# Patient Record
Sex: Female | Born: 1980 | Marital: Married | Smoking: Never smoker
Health system: Southern US, Community
[De-identification: ages and names within clinical notes are randomized; demographics above are authoritative.]

## PROBLEM LIST (undated history)

## (undated) DIAGNOSIS — E039 Hypothyroidism, unspecified: Secondary | ICD-10-CM

## (undated) HISTORY — PX: NO PAST SURGERIES: SHX2092

---

## 2016-04-06 ENCOUNTER — Other Ambulatory Visit (HOSPITAL_COMMUNITY)
Admission: RE | Admit: 2016-04-06 | Discharge: 2016-04-06 | Disposition: A | Discharge status: Home / Self Care | Payer: Managed Care, Other (non HMO) | Source: Ambulatory Visit | Attending: Family Medicine | Admitting: Family Medicine

## 2016-04-06 DIAGNOSIS — Z01419 Encounter for gynecological examination (general) (routine) without abnormal findings: Secondary | ICD-10-CM | POA: Insufficient documentation

## 2016-04-06 DIAGNOSIS — Z1151 Encounter for screening for human papillomavirus (HPV): Secondary | ICD-10-CM | POA: Insufficient documentation

## 2019-01-13 ENCOUNTER — Other Ambulatory Visit: Payer: Self-pay | Admitting: Family Medicine

## 2019-01-13 DIAGNOSIS — N6320 Unspecified lump in the left breast, unspecified quadrant: Secondary | ICD-10-CM

## 2019-01-16 ENCOUNTER — Ambulatory Visit
Admission: RE | Admit: 2019-01-16 | Discharge: 2019-01-16 | Disposition: A | Discharge status: Home / Self Care | Payer: 59 | Source: Ambulatory Visit | Attending: Family Medicine | Admitting: Family Medicine

## 2019-01-16 ENCOUNTER — Ambulatory Visit
Admission: RE | Admit: 2019-01-16 | Discharge: 2019-01-16 | Disposition: A | Discharge status: Home / Self Care | Payer: Managed Care, Other (non HMO) | Source: Ambulatory Visit | Attending: Family Medicine | Admitting: Family Medicine

## 2019-01-16 DIAGNOSIS — N6320 Unspecified lump in the left breast, unspecified quadrant: Secondary | ICD-10-CM

## 2020-02-20 IMAGING — MG DIGITAL DIAGNOSTIC BILATERAL MAMMOGRAM WITH TOMO AND CAD
6 of 10 series · 6 of 30 positions shown · non-contrast
Comparison: Previous exam(s).

CLINICAL DATA: 37-year-old patient recently has palpated a lump in
the upper inner left breast. This is her baseline mammogram. She has
no family history of breast cancer.

EXAM:
DIGITAL DIAGNOSTIC BILATERAL MAMMOGRAM WITH CAD AND TOMO
ULTRASOUND LEFT BREAST

[L MLO synth-2D]
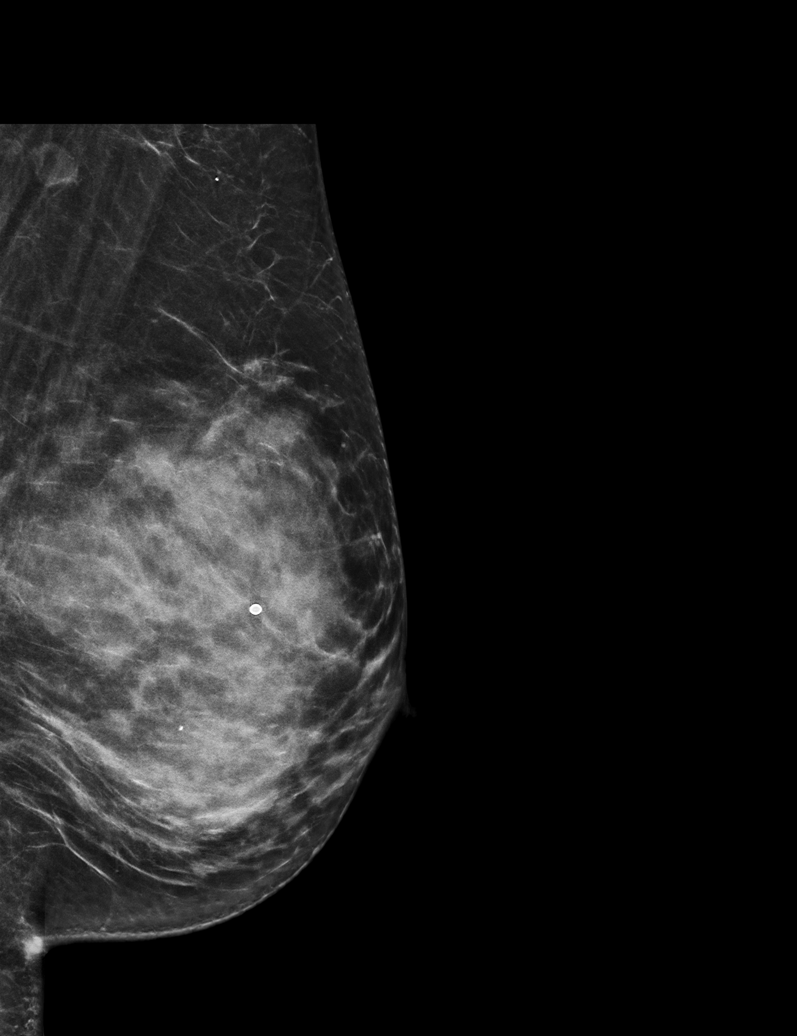

[L ML synth-2D]
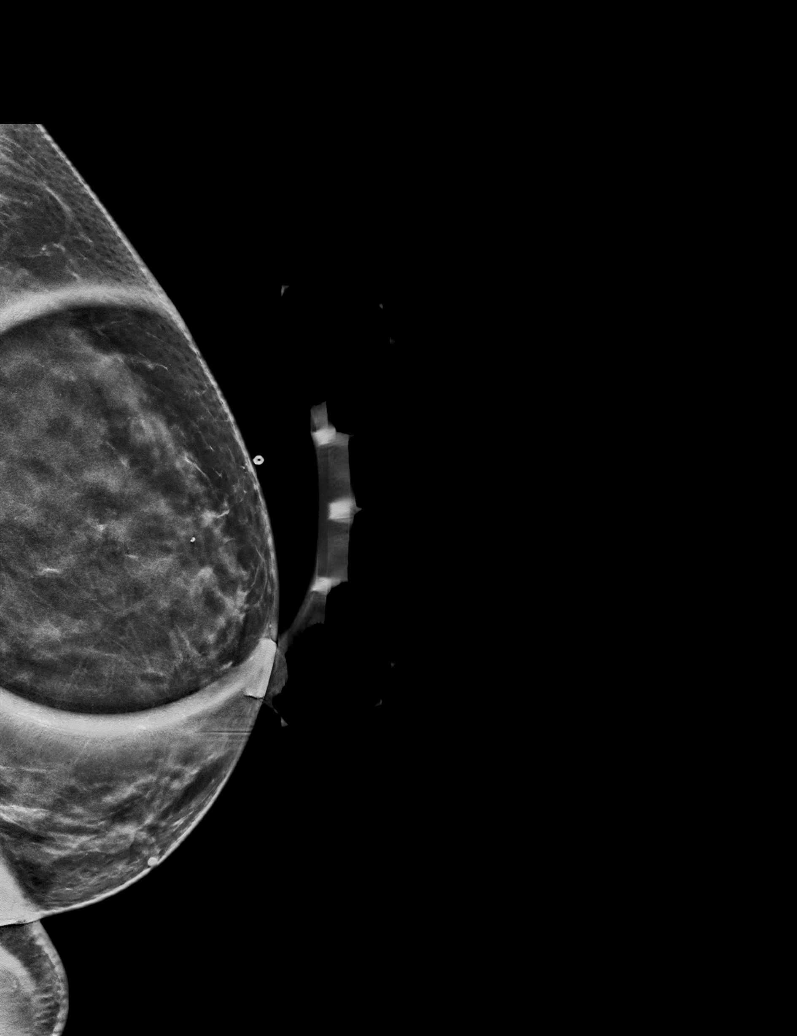

[R MLO synth-2D]
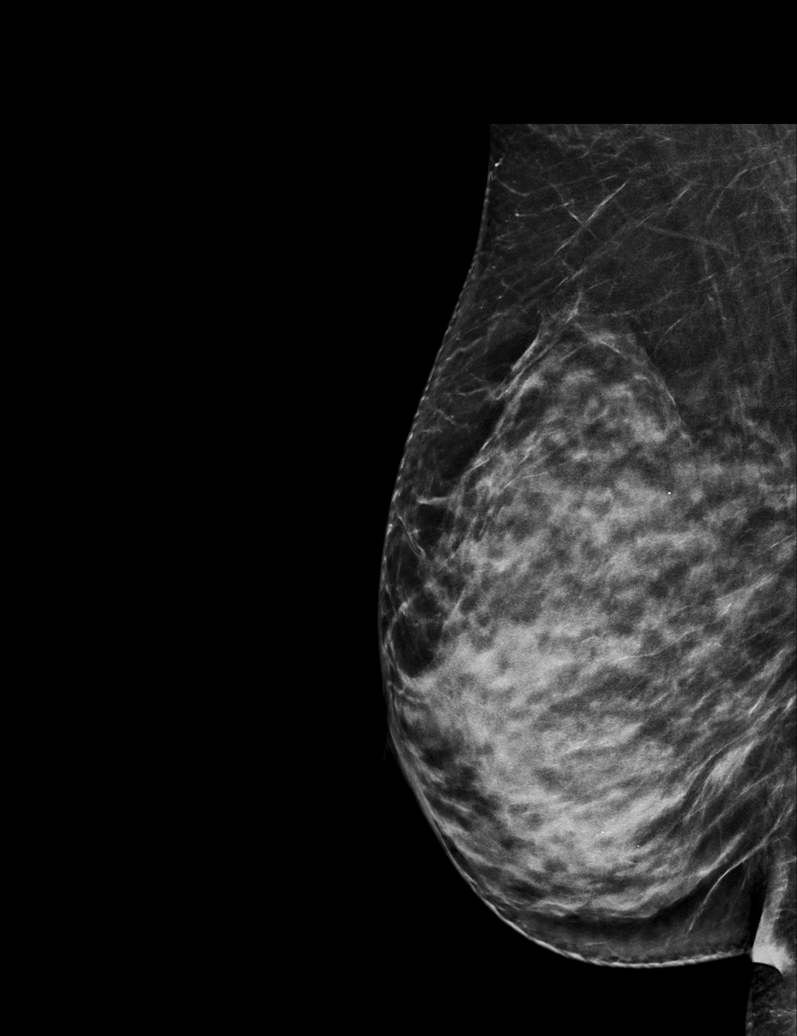

[R CC synth-2D]
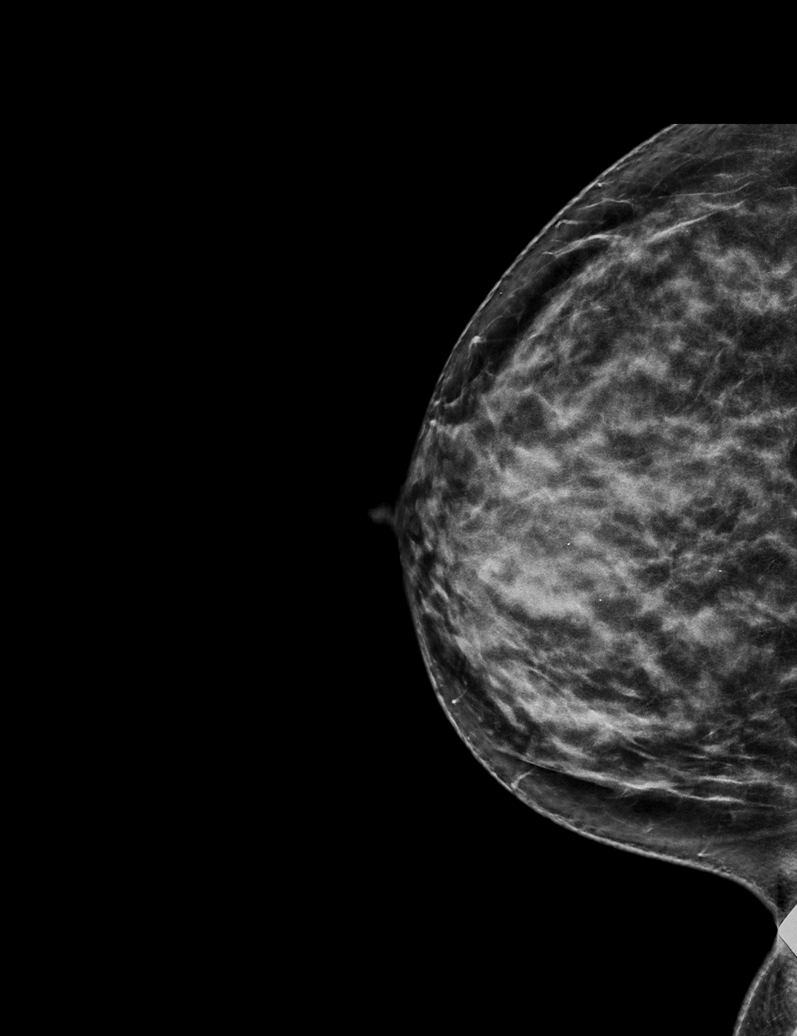

[L CC synth-2D]
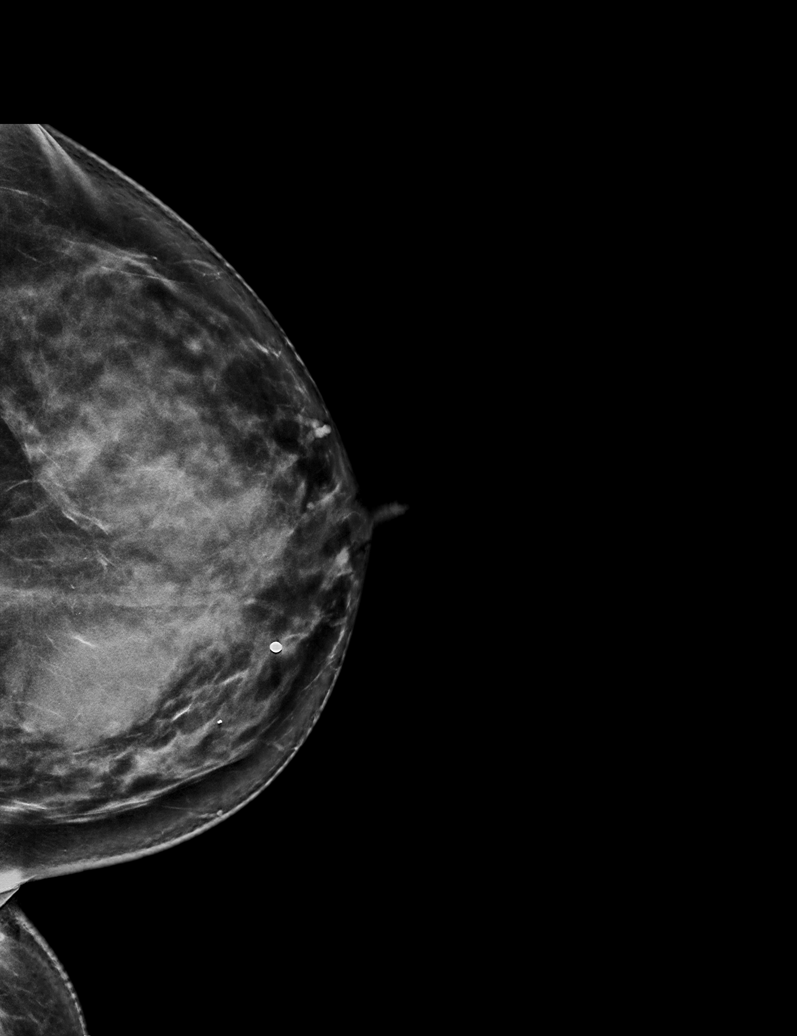

[R MLO tomo · tomo slice 35/70.0]
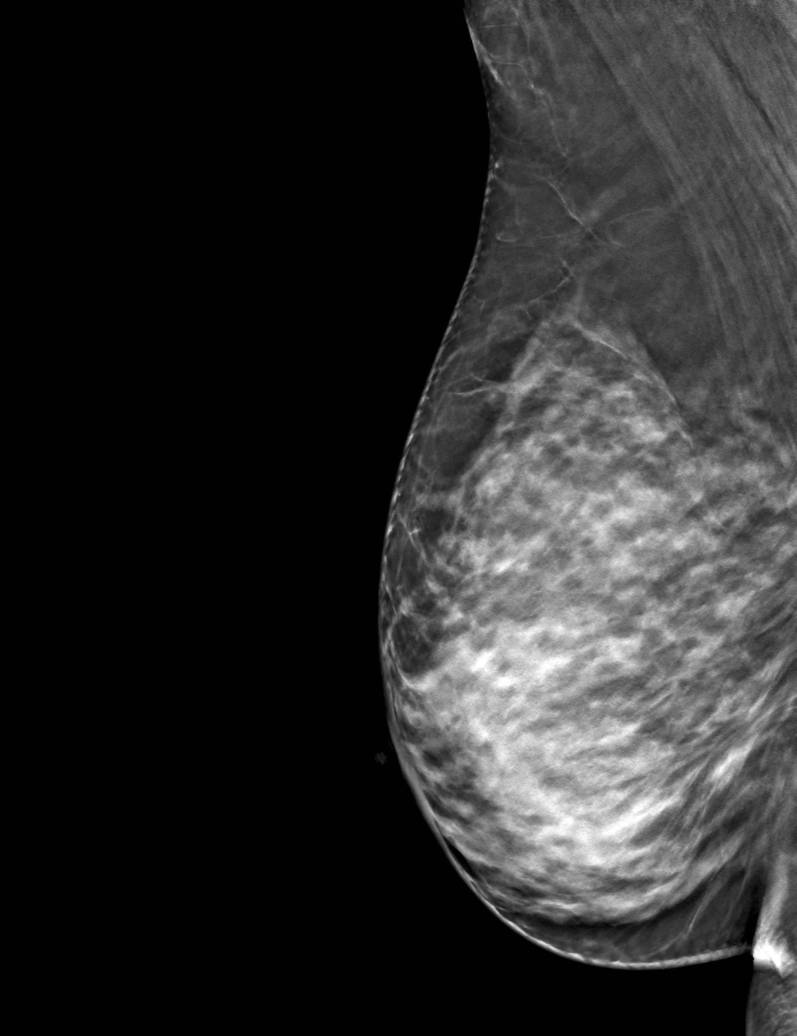

[6 of 30 positions shown; findings below may reference images not displayed]

ACR Breast Density Category d: The breast tissue is extremely dense,
which lowers the sensitivity of mammography.
FINDINGS: Metallic skin marker was placed in the region of palpable concern in
the upper inner left breast. Deep to the metallic skin marker is a
partially obscured oval mass measuring approximately 3.5 cm.
Immediately adjacent to this dominant mass is a second smaller oval
mass. No suspicious microcalcification or architectural distortion
in the left breast.

There are no findings to suggest malignancy in the right breast.

Mammographic images were processed with CAD.

Targeted ultrasound is performed of the palpable lump in the left
breast at 10 o'clock position 3 cm from the nipple. Corresponding to
the palpable mass is a 3.4 x 2.3 x 2.3 cm cyst with well-defined
posterior wall and posterior acoustic enhancement. There is no
associated vascular flow. Immediately adjacent to the palpable cyst
is a smaller oval cyst. Incidentally noted in the 11 o'clock left
breast 3 cm from nipple are two oval simple cysts, the largest
measuring approximately 1.2 cm.

No solid or suspicious masses identified in this region of the left
breast.
IMPRESSION: No evidence of malignancy in either breast. 3.4 cm benign cyst at 10
o'clock position left breast accounts for the palpable lump. A few
additional smaller cysts are seen in the upper inner quadrant of the
left breast on ultrasound.

No evidence of malignancy in either breast.

RECOMMENDATION:
Screening mammogram at age 41 unless there are persistent or
intervening clinical concerns. (Code:[id number]-[id number])

I have discussed the findings and recommendations with the patient.
Results were also provided in writing at the conclusion of the
visit. If applicable, a reminder letter will be sent to the patient
regarding the next appointment.

BI-RADS CATEGORY  2: Benign.

## 2020-04-01 ENCOUNTER — Other Ambulatory Visit: Payer: Self-pay | Admitting: Family Medicine

## 2020-04-05 ENCOUNTER — Ambulatory Visit
Admission: RE | Admit: 2020-04-05 | Discharge: 2020-04-05 | Disposition: A | Discharge status: Home / Self Care | Payer: 59 | Source: Ambulatory Visit | Attending: Family Medicine | Admitting: Family Medicine

## 2020-04-05 ENCOUNTER — Other Ambulatory Visit: Payer: Self-pay | Admitting: Family Medicine

## 2020-04-05 ENCOUNTER — Other Ambulatory Visit: Payer: Self-pay

## 2020-04-05 DIAGNOSIS — N6320 Unspecified lump in the left breast, unspecified quadrant: Secondary | ICD-10-CM

## 2020-04-05 DIAGNOSIS — N6310 Unspecified lump in the right breast, unspecified quadrant: Secondary | ICD-10-CM

## 2020-04-14 ENCOUNTER — Other Ambulatory Visit: Payer: Self-pay | Admitting: Family Medicine

## 2020-04-14 DIAGNOSIS — Z803 Family history of malignant neoplasm of breast: Secondary | ICD-10-CM

## 2020-04-14 DIAGNOSIS — Z1239 Encounter for other screening for malignant neoplasm of breast: Secondary | ICD-10-CM

## 2020-04-19 ENCOUNTER — Other Ambulatory Visit (HOSPITAL_COMMUNITY)
Admission: RE | Admit: 2020-04-19 | Discharge: 2020-04-19 | Disposition: A | Discharge status: Home / Self Care | Payer: 59 | Source: Ambulatory Visit | Attending: Family Medicine | Admitting: Family Medicine

## 2020-04-19 ENCOUNTER — Other Ambulatory Visit: Payer: Self-pay | Admitting: Family Medicine

## 2020-04-19 DIAGNOSIS — Z124 Encounter for screening for malignant neoplasm of cervix: Secondary | ICD-10-CM | POA: Insufficient documentation

## 2020-04-22 LAB — CYTOLOGY - PAP
Comment: NEGATIVE
Diagnosis: NEGATIVE
High risk HPV: NEGATIVE

## 2020-06-15 ENCOUNTER — Other Ambulatory Visit: Payer: Self-pay

## 2020-06-15 ENCOUNTER — Ambulatory Visit
Admission: RE | Admit: 2020-06-15 | Discharge: 2020-06-15 | Disposition: A | Discharge status: Home / Self Care | Payer: 59 | Source: Ambulatory Visit | Attending: Family Medicine | Admitting: Family Medicine

## 2020-06-15 DIAGNOSIS — Z1239 Encounter for other screening for malignant neoplasm of breast: Secondary | ICD-10-CM

## 2020-06-15 DIAGNOSIS — Z803 Family history of malignant neoplasm of breast: Secondary | ICD-10-CM

## 2020-06-15 MED ORDER — GADOBUTROL 1 MMOL/ML IV SOLN
7.0000 mL | Freq: Once | INTRAVENOUS | Status: AC | PRN
  Administered 2020-06-15: 7 mL via INTRAVENOUS

## 2021-05-11 ENCOUNTER — Other Ambulatory Visit: Payer: Self-pay | Admitting: Family Medicine

## 2021-05-11 DIAGNOSIS — Z1231 Encounter for screening mammogram for malignant neoplasm of breast: Secondary | ICD-10-CM

## 2021-07-08 ENCOUNTER — Ambulatory Visit: Payer: 59

## 2021-10-19 ENCOUNTER — Ambulatory Visit
Admission: RE | Admit: 2021-10-19 | Discharge: 2021-10-19 | Disposition: A | Discharge status: Home / Self Care | Payer: 59 | Source: Ambulatory Visit | Attending: Family Medicine | Admitting: Family Medicine

## 2021-10-19 ENCOUNTER — Other Ambulatory Visit: Payer: Self-pay

## 2021-10-19 DIAGNOSIS — Z1231 Encounter for screening mammogram for malignant neoplasm of breast: Secondary | ICD-10-CM

## 2022-07-18 ENCOUNTER — Other Ambulatory Visit: Payer: Self-pay

## 2022-07-18 ENCOUNTER — Encounter (HOSPITAL_BASED_OUTPATIENT_CLINIC_OR_DEPARTMENT_OTHER): Payer: Self-pay | Admitting: Emergency Medicine

## 2022-07-18 ENCOUNTER — Emergency Department (HOSPITAL_BASED_OUTPATIENT_CLINIC_OR_DEPARTMENT_OTHER): Payer: 59

## 2022-07-18 ENCOUNTER — Emergency Department (HOSPITAL_BASED_OUTPATIENT_CLINIC_OR_DEPARTMENT_OTHER)
Admission: EM | Admit: 2022-07-18 | Discharge: 2022-07-18 | Disposition: A | Discharge status: Home / Self Care | Payer: 59 | Attending: Emergency Medicine | Admitting: Emergency Medicine

## 2022-07-18 DIAGNOSIS — M79641 Pain in right hand: Secondary | ICD-10-CM | POA: Diagnosis present

## 2022-07-18 DIAGNOSIS — M79643 Pain in unspecified hand: Secondary | ICD-10-CM

## 2022-07-18 NOTE — Discharge Instructions (Signed)
Your x-ray today was normal.  You were diagnosed with a possible scaphoid fracture.  We placed you in a thumb spica splint today.  Please keep this on until evaluated by Orthopedics. Please call Dr. Melvyn Novas tomorrow morning to schedule follow-up appointment.

## 2022-07-18 NOTE — ED Provider Notes (Signed)
[facility] EMERGENCY DEPARTMENT Provider Note   CSN: [id number] Arrival date & time: 07/18/22  2050     History PMH: n/a Chief Complaint  Patient presents with   Hand Pain    Suzanne Quinn is a 41 y.o. female. She presents with a right hand injury after being pulled by her dog on a leash.  She has had right hand pain in her lower thumb as well as limited range of motion of her thumb.  She took Tylenol and ibuprofen at home and just now getting some minimal relief.  She denies any numbness or tingling in the extremity.   Hand Pain       Home Medications Prior to Admission medications   Not on File      Allergies    Patient has no known allergies.    Review of Systems   Review of Systems  Musculoskeletal:  Positive for arthralgias.  Neurological:  Negative for numbness.  All other systems reviewed and are negative.   Physical Exam Updated Vital Signs BP 115/75 (BP Location: Left Arm)   Pulse 74   Temp 98.3 F (36.8 C) (Oral)   Resp 20   Ht 5\' 7"  (1.702 m)   Wt 70.3 kg   SpO2 100%   BMI 24.28 kg/m  Physical Exam Vitals and nursing note reviewed.  Constitutional:      General: She is not in acute distress.    Appearance: Normal appearance. She is well-developed. She is not ill-appearing, toxic-appearing or diaphoretic.  HENT:     Head: Normocephalic and atraumatic.     Nose: No nasal deformity.     Mouth/Throat:     Lips: Pink. No lesions.  Eyes:     General: Gaze aligned appropriately. No scleral icterus.       Right eye: No discharge.        Left eye: No discharge.     Conjunctiva/sclera: Conjunctivae normal.     Right eye: Right conjunctiva is not injected. No exudate or hemorrhage.    Left eye: Left conjunctiva is not injected. No exudate or hemorrhage. Pulmonary:     Effort: Pulmonary effort is normal. No respiratory distress.  Musculoskeletal:     Comments: Right wrist is with no deformities or swelling.  She has a 2+ radial pulse.   Normal range of motion of her right wrist.  She does have positive snuffbox tenderness as well as limited range of motion of her left thumb.  There is minimal swelling overlying the snuffbox.  She is unable to fully flex and extend this.  Her sensation is intact distally.  She has no other areas of concern on her hand.  Skin:    General: Skin is warm and dry.  Neurological:     Mental Status: She is alert and oriented to person, place, and time.  Psychiatric:        Mood and Affect: Mood normal.        Speech: Speech normal.        Behavior: Behavior normal. Behavior is cooperative.     ED Results / Procedures / Treatments   Labs (all labs ordered are listed, but only abnormal results are displayed) Labs Reviewed - No data to display  EKG None  Radiology DG Hand Complete Right  Result Date: 07/18/2022 CLINICAL DATA:  Hand pain thumb pain EXAM: RIGHT HAND - COMPLETE 3+ VIEW COMPARISON:  None Available. FINDINGS: There is no evidence of fracture or dislocation. There is no  evidence of arthropathy or other focal bone abnormality. Soft tissues are unremarkable. IMPRESSION: Negative. Electronically Signed   By: Jasmine Pang M.D.   On: 07/18/2022 21:17    Procedures Procedures    Medications Ordered in ED Medications - No data to display  ED Course/ Medical Decision Making/ A&P                           Medical Decision Making Amount and/or Complexity of Data Reviewed Radiology: ordered.   Patient presents after a right hand injury that occurred today.  She is neurovascularly intact on exam.  She does have positive snuffbox tenderness.  An x-ray was obtained which did not show any acute fractures.  However, since patient has limited range of motion of the right thumb as well as snuffbox tenderness, we will place her in a thumb spica splint in case this is an occult scaphoid fracture.  She will follow-up with orthopedics.  I have recommended supportive treatment at home.  Final  Clinical Impression(s) / ED Diagnoses Final diagnoses:  Tenderness of anatomical snuffbox    Rx / DC Orders ED Discharge Orders     None         Claudie Leach, PA-C 07/18/22 2226    Rolan Bucco, MD 07/18/22 2250

## 2022-07-18 NOTE — ED Triage Notes (Signed)
Patient states she was pulled by her dog on a leash, c/o right hand pain, more in her thumb. Patient took tylenol and ibuprofen pta.

## 2022-07-31 ENCOUNTER — Other Ambulatory Visit: Payer: Self-pay | Admitting: Family Medicine

## 2022-07-31 ENCOUNTER — Other Ambulatory Visit: Payer: Self-pay | Admitting: Physician Assistant

## 2022-07-31 DIAGNOSIS — Z1231 Encounter for screening mammogram for malignant neoplasm of breast: Secondary | ICD-10-CM

## 2022-11-20 ENCOUNTER — Ambulatory Visit
Admission: RE | Admit: 2022-11-20 | Discharge: 2022-11-20 | Disposition: A | Discharge status: Home / Self Care | Payer: 59 | Source: Ambulatory Visit | Attending: Physician Assistant | Admitting: Physician Assistant

## 2022-11-20 DIAGNOSIS — Z1231 Encounter for screening mammogram for malignant neoplasm of breast: Secondary | ICD-10-CM

## 2023-05-11 ENCOUNTER — Ambulatory Visit (INDEPENDENT_AMBULATORY_CARE_PROVIDER_SITE_OTHER): Payer: 59 | Admitting: Student

## 2023-05-11 ENCOUNTER — Ambulatory Visit (INDEPENDENT_AMBULATORY_CARE_PROVIDER_SITE_OTHER): Payer: 59

## 2023-05-11 DIAGNOSIS — M79672 Pain in left foot: Secondary | ICD-10-CM

## 2023-05-11 DIAGNOSIS — M722 Plantar fascial fibromatosis: Secondary | ICD-10-CM

## 2023-05-11 HISTORY — DX: Plantar fascial fibromatosis: M72.2

## 2023-05-11 NOTE — Progress Notes (Signed)
Chief Complaint: Left foot pain     History of Present Illness:    Suzanne Quinn is a 42 y.o. female presenting today for evaluation of pain in her left foot and heel.  She states that just over a week ago she went for a 3 mile walk and shoes that were not very supportive.  She typically wears orthotic shoe inserts.  She has since been having a burning type pain in her heel and arch that is mild but very bothersome.  This is painful even at rest however it worsens with a lot of walking.  It also does bother her at night when trying to sleep.  She has been performing exercises on her own at home for plantar fasciitis.  Overall she states that this has been improving some over the last few days but is here for further evaluation to make sure nothing else is going on.   Surgical History:   None  PMH/PSH/Family History/Social History/Meds/Allergies:   No past medical history on file. No past surgical history on file. Social History   Socioeconomic History   Marital status: Unknown    Spouse name: Not on file   Number of children: Not on file   Years of education: Not on file   Highest education level: Not on file  Occupational History   Not on file  Tobacco Use   Smoking status: Never   Smokeless tobacco: Never  Substance and Sexual Activity   Alcohol use: Never   Drug use: Never   Sexual activity: Not on file  Other Topics Concern   Not on file  Social History Narrative   Not on file   Social Determinants of Health   Financial Resource Strain: Not on file  Food Insecurity: Not on file  Transportation Needs: Not on file  Physical Activity: Not on file  Stress: Not on file  Social Connections: Not on file   Family History  Problem Relation Age of Onset   Breast cancer Sister    No Known Allergies No current outpatient medications on file.   No current facility-administered medications for this visit.   No results found.  Review  of Systems:   A ROS was performed including pertinent positives and negatives as documented in the HPI.  Physical Exam :   Constitutional: NAD and appears stated age Neurological: Alert and oriented Psych: Appropriate affect and cooperative There were no vitals taken for this visit.   Comprehensive Musculoskeletal Exam:    Mild tenderness to palpation of calcaneal tuberosity as well as plantar aspect of the left foot.  This is not exacerbated with passive extension of the toes.  Active ankle range of motion to 20 degrees dorsiflexion and 40 degrees plantarflexion without pain.  DP pulse 2+.  Neurosensory exam intact.  Imaging:   Xray (left foot 3 views): Negative   I personally reviewed and interpreted the radiographs.   Assessment:   42 y.o. female with approximately 1 week history of left heel and plantar foot pain.  Her physical exam and history of walking in nonsupportive shoes is very consistent with plantar fasciitis.  We did discuss that as this is seemingly a flareup after her walk last week, I am hopeful this is more acute and chronic.  She is already performing plantar fascia at a specific  stretches at home and her symptoms have been improving.  I would recommend utilizing icing and massage with a frozen water bottle.  She can also take anti-inflammatories as needed.  Discussed that if her symptoms should not significantly improve or resolve within the next 3 to 4 weeks, we can always reevaluate and consider other therapy modalities at that point.  Plan :    - Return to clinic as needed     I personally saw and evaluated the patient, and participated in the management and treatment plan.  Hazle Nordmann, PA-C Orthopedics  This document was dictated using Conservation officer, historic buildings. A reasonable attempt at proof reading has been made to minimize errors.

## 2024-05-17 ENCOUNTER — Emergency Department (HOSPITAL_BASED_OUTPATIENT_CLINIC_OR_DEPARTMENT_OTHER)
Admission: EM | Admit: 2024-05-17 | Discharge: 2024-05-17 | Disposition: A | Discharge status: Home / Self Care | Attending: Emergency Medicine | Admitting: Emergency Medicine

## 2024-05-17 ENCOUNTER — Encounter (HOSPITAL_BASED_OUTPATIENT_CLINIC_OR_DEPARTMENT_OTHER): Payer: Self-pay

## 2024-05-17 ENCOUNTER — Emergency Department (HOSPITAL_BASED_OUTPATIENT_CLINIC_OR_DEPARTMENT_OTHER)

## 2024-05-17 ENCOUNTER — Other Ambulatory Visit: Payer: Self-pay

## 2024-05-17 DIAGNOSIS — R079 Chest pain, unspecified: Secondary | ICD-10-CM | POA: Insufficient documentation

## 2024-05-17 DIAGNOSIS — E039 Hypothyroidism, unspecified: Secondary | ICD-10-CM | POA: Insufficient documentation

## 2024-05-17 DIAGNOSIS — R61 Generalized hyperhidrosis: Secondary | ICD-10-CM | POA: Insufficient documentation

## 2024-05-17 HISTORY — DX: Hypothyroidism, unspecified: E03.9

## 2024-05-17 LAB — BASIC METABOLIC PANEL WITH GFR
Anion gap: 13 (ref 5–15)
BUN: 6 mg/dL (ref 6–20)
CO2: 21 mmol/L — ABNORMAL LOW (ref 22–32)
Calcium: 9 mg/dL (ref 8.9–10.3)
Chloride: 103 mmol/L (ref 98–111)
Creatinine, Ser: 0.66 mg/dL (ref 0.44–1.00)
GFR, Estimated: >60 mL/min
Glucose, Bld: 85 mg/dL (ref 70–99)
Potassium: 4 mmol/L (ref 3.5–5.1)
Sodium: 137 mmol/L (ref 135–145)

## 2024-05-17 LAB — CBC
HCT: 36.5 % (ref 36.0–46.0)
Hemoglobin: 12.1 g/dL (ref 12.0–15.0)
MCH: 29.7 pg (ref 26.0–34.0)
MCHC: 33.2 g/dL (ref 30.0–36.0)
MCV: 89.7 fL (ref 80.0–100.0)
Platelets: 343 10*3/uL (ref 150–400)
RBC: 4.07 MIL/uL (ref 3.87–5.11)
RDW: 12.8 % (ref 11.5–15.5)
WBC: 8 10*3/uL (ref 4.0–10.5)
nRBC: 0 % (ref 0.0–0.2)

## 2024-05-17 LAB — RESP PANEL BY RT-PCR (RSV, FLU A&B, COVID)  RVPGX2
Influenza A by PCR: NEGATIVE
Influenza B by PCR: NEGATIVE
Resp Syncytial Virus by PCR: NEGATIVE
SARS Coronavirus 2 by RT PCR: NEGATIVE

## 2024-05-17 LAB — PREGNANCY, URINE: Preg Test, Ur: NEGATIVE

## 2024-05-17 LAB — TROPONIN T, HIGH SENSITIVITY: Troponin T High Sensitivity: <15 ng/L

## 2024-05-17 NOTE — ED Provider Notes (Signed)
 [place] AT [facility] Provider Note   CSN: [id number] Arrival date & time: 05/17/24  1353     History  Chief Complaint  Patient presents with   Chest Pain    Suzanne Quinn is a 43 y.o. female.  43 year old female presenting with chest pain.  Patient woke up around 5 AM, this is abnormal for her, when she woke up she noted discomfort to her anterior chest without radiation.  She has no pain right now, but feels that if she moves around in the bed enough she will develop the pain in her chest again, says that it is worse when she "stretches".  She reports feeling "out of sorts today", feeling like she needs to take a deep breath but otherwise is not short of breath. She did experience a brief episode of diaphoresis this morning but no fever, this has since resolved.  No nausea/vomiting, she has had several bowel movements today but denies diarrhea.   Chest Pain      Home Medications Prior to Admission medications   Not on File      Allergies    Patient has no known allergies.    Review of Systems   Review of Systems  Cardiovascular:  Positive for chest pain.    Physical Exam Updated Vital Signs BP 111/80   Pulse 76   Temp 98 F (36.7 C)   Resp 14   Wt 72.1 kg   LMP  (LMP Unknown)   SpO2 100%   BMI 24.90 kg/m  Physical Exam Vitals and nursing note reviewed.  HENT:     Head: Normocephalic.  Cardiovascular:     Rate and Rhythm: Normal rate and regular rhythm.     Pulses:          Radial pulses are 2+ on the right side and 2+ on the left side.     Heart sounds: Normal heart sounds.  Pulmonary:     Effort: Pulmonary effort is normal.     Breath sounds: Normal breath sounds.  Abdominal:     Palpations: Abdomen is soft.  Musculoskeletal:     Cervical back: Normal range of motion.     Comments: Moves all extremities spontaneously without difficulty  Skin:    General: Skin is warm and dry.  Neurological:     Mental Status:  She is alert.     ED Results / Procedures / Treatments   Labs (all labs ordered are listed, but only abnormal results are displayed) Labs Reviewed  BASIC METABOLIC PANEL WITH GFR - Abnormal; Notable for the following components:      Result Value   CO2 21 (*)    All other components within normal limits  RESP PANEL BY RT-PCR (RSV, FLU A&B, COVID)  [id number]  CBC  PREGNANCY, URINE  TROPONIN T, HIGH SENSITIVITY  TROPONIN T, HIGH SENSITIVITY    EKG EKG Interpretation Date/Time:  Saturday May 17 2024 14:01:56 EDT Ventricular Rate:  94 PR Interval:  177 QRS Duration:  114 QT Interval:  364 QTC Calculation: 456 R Axis:   73  Text Interpretation: Sinus rhythm Left atrial enlargement Incomplete right bundle branch block No old tracing to compare Confirmed by Sueellen Emery [phone]) on 05/17/2024 2:10:24 PM  Radiology No results found.  Procedures Procedures    Medications Ordered in ED Medications - No data to display  ED Course/ Medical Decision Making/ A&P  Medical Decision Making This patient presents to the ED for concern of chest pain, this involves an extensive number of treatment options, and is a complaint that carries with it a high risk of complications and morbidity.  The differential diagnosis includes ACS, unstable vs stable angina, PE, GERD/reflux, musculoskeletal pain.   Co morbidities that complicate the patient evaluation  Hypothyroidism    Additional history obtained:  Additional history obtained from record review External records from outside source obtained and reviewed including recent PCP notes   Lab Tests:  I Ordered, and personally interpreted labs.  The pertinent results include: CBC within normal limits, BMP unremarkable.  COVID/flu/RSV negative.  Initial troponin less than 15, low suspicion for ACS therefore will not repeat second troponin.  Urine hCG negative.   Imaging Studies ordered:  I ordered  imaging studies including chest XR  I independently visualized and interpreted imaging which showed No active cardiopulmonary disease.  I agree with the radiologist interpretation   Cardiac Monitoring: / EKG:  The patient was maintained on a cardiac monitor.  I personally viewed and interpreted the cardiac monitored which showed an underlying rhythm of: normal sinus rhythm   Problem List / ED Course / Critical interventions / Medication management  I have reviewed the patients home medicines and have made adjustments as needed   Test / Admission - Considered:  Physical exam largely reassuring, normal S1/S2, lungs clear to auscultation bilaterally without adventitious sounds.  Cardiac workup was reassuring, see above for lab/imaging/EKG results.  I did not order a D-dimer to assess patient for a pulmonary embolism, given that she is low risk and PERC is negative.  I discussed these findings in depth with patient and her husband, she does mention that she was using a hedge tremor in the yard several days ago and is wondering if the vibrations from using the machine have caused her muscle pain in her chest.  Upon reassessment her discomfort has largely resolved, at this time I do not feel that her chest pain is cardiac in nature, but may be related to GERD/musculoskeletal pain.  Using shared decision making I did discuss with the patient and her partner if she would like for us  to obtain a second troponin, however I did reiterate that I do not feel it is necessary in her case given that she is low risk and relatively healthy otherwise, she is in agreement and does not wish to wait on second troponin, I feel this plan is reasonable.  Advised patient to follow-up with her primary care provider next week, return precautions discussed.  Patient is agreeable with this plan and is appropriate discharge at this time.    Amount and/or Complexity of Data Reviewed Labs: ordered. Radiology:  ordered.           Final Clinical Impression(s) / ED Diagnoses Final diagnoses:  Chest pain, unspecified type    Rx / DC Orders ED Discharge Orders     None         Kendrick Pax, PA-C 05/17/24 1616    Rolinda Climes, DO 05/17/24 2351

## 2024-05-17 NOTE — ED Triage Notes (Signed)
 Pt reports difficulty sleeping. Woke up with central chest pain, neck pain and dizziness. Multiple BM's but solid. Feels restless

## 2024-05-17 NOTE — Discharge Instructions (Addendum)
 Return to the emergency department if your symptoms worsen.  Follow-up with your primary care provider next week. Continue Ibuprofen/Tylenol as needed for pain.

## 2024-06-24 ENCOUNTER — Ambulatory Visit (INDEPENDENT_AMBULATORY_CARE_PROVIDER_SITE_OTHER): Admitting: Physician Assistant

## 2024-06-24 VITALS — BP 103/67 | HR 70 | Ht 67.5 in | Wt 156.4 lb

## 2024-06-24 DIAGNOSIS — Z87898 Personal history of other specified conditions: Secondary | ICD-10-CM

## 2024-06-24 DIAGNOSIS — E039 Hypothyroidism, unspecified: Secondary | ICD-10-CM | POA: Insufficient documentation

## 2024-06-24 DIAGNOSIS — Z803 Family history of malignant neoplasm of breast: Secondary | ICD-10-CM | POA: Insufficient documentation

## 2024-06-24 DIAGNOSIS — R0789 Other chest pain: Secondary | ICD-10-CM

## 2024-06-24 DIAGNOSIS — R5383 Other fatigue: Secondary | ICD-10-CM | POA: Diagnosis not present

## 2024-06-24 DIAGNOSIS — Z9189 Other specified personal risk factors, not elsewhere classified: Secondary | ICD-10-CM

## 2024-06-24 HISTORY — DX: Hypothyroidism, unspecified: E03.9

## 2024-06-24 HISTORY — DX: Family history of malignant neoplasm of breast: Z80.3

## 2024-06-24 HISTORY — DX: Other chest pain: R07.89

## 2024-06-24 NOTE — Assessment & Plan Note (Signed)
 Manages with levothyroxine 75 mcg. Given pt reported fatigue, repeat tsh/t4

## 2024-06-24 NOTE — Assessment & Plan Note (Signed)
 Sister dx at 14, deceased at 55. Pt reported sister with negative genetic testing. Reviewed high risk screening of mammogram, MRI.  Pt is due for both. Would start with MRI and then q 6 mo mammo

## 2024-06-24 NOTE — Assessment & Plan Note (Signed)
 Reviewed ED visit, labs, EKG.  I do appreciate as musculoskeletal, but will check additional labs, lipids, and refer to cardiology.    Advise use of naproxen for pain management as needed

## 2024-06-24 NOTE — Progress Notes (Signed)
 New patient visit   Patient: Suzanne Quinn   DOB: 02/05/1981   43 y.o. Female  MRN: [id number] Visit Date: 06/24/2024  Today's healthcare provider: Manuelita Flatness, PA-C   Cc. New patient, chest pain  Subjective    Suzanne Quinn is a 43 y.o. female who presents today as a new patient to establish care.   Pt was seen in the ED for chest pain 05/17/24, labs normal, EKG Sinus rhythm Left atrial enlargement Incomplete right bundle branch block, no comparison. chest xray negative.   Discussed the use of AI scribe software for clinical note transcription with the patient, who gave verbal consent to proceed.  History of Present Illness   Suzanne Quinn is a 43 year old female who presents with chest discomfort.  She has experienced chest discomfort for two years, with a significant exacerbation a month ago leading to an emergency room visit. The discomfort is described as a pressure-like sensation across the chest, worsened by certain movements and palpation. It is intermittent and partially relieved by Aleve. The pain is associated with physical activity, such as golfing, and is not related to eating habits. Sometimes tender to touch.  She feels out of energy and lethargic, with a sensation of swelling but no weight change or visible swelling in her lower extremities. She is on medication for a thyroid condition. Her menstrual cycles have become shorter but occur monthly.  Family history includes a sister diagnosed with breast cancer at 15, passed at 61 with negative genetic testing. She has been having regular mammograms and breast MRIs.       Past Medical History:  Diagnosis Date   Hypothyroid    History reviewed. No pertinent surgical history. Family Status  Relation Name Status   Sister Suzanne Quinn Deceased  No partnership data on file   Family History  Problem Relation Age of Onset   Cancer Sister    Breast cancer Sister        42 y/o   Social History   Socioeconomic  History   Marital status: Married    Spouse name: Not on file   Number of children: Not on file   Years of education: Not on file   Highest education level: Bachelor's degree (e.g., BA, AB, BS)  Occupational History   Not on file  Tobacco Use   Smoking status: Never   Smokeless tobacco: Never  Substance and Sexual Activity   Alcohol use: Never   Drug use: Never   Sexual activity: Yes    Birth control/protection: Condom  Other Topics Concern   Not on file  Social History Narrative   Not on file   Social Drivers of Health   Financial Resource Strain: Low Risk  (06/24/2024)   Overall Financial Resource Strain (CARDIA)    Difficulty of Paying Living Expenses: Not hard at all  Food Insecurity: No Food Insecurity (06/24/2024)   Hunger Vital Sign    Worried About Running Out of Food in the Last Year: Never true    Ran Out of Food in the Last Year: Never true  Transportation Needs: No Transportation Needs (06/24/2024)   PRAPARE - Administrator, Civil Service (Medical): No    Lack of Transportation (Non-Medical): No  Physical Activity: Insufficiently Active (06/24/2024)   Exercise Vital Sign    Days of Exercise per Week: 3 days    Minutes of Exercise per Session: 30 min  Stress: No Stress Concern Present (06/24/2024)   [employer] of  Occupational Health - Occupational Stress Questionnaire    Feeling of Stress: Not at all  Social Connections: Socially Integrated (06/24/2024)   Social Connection and Isolation Panel    Frequency of Communication with Friends and Family: More than three times a week    Frequency of Social Gatherings with Friends and Family: Once a week    Attends Religious Services: More than 4 times per year    Active Member of [employer] or Organizations: Yes    Attends Engineer, structural: More than 4 times per year    Marital Status: Married   Outpatient Medications Prior to Visit  Medication Sig   Cholecalciferol 125 MCG (5000 UT) capsule Take  5,000 Units by mouth.   levothyroxine (SYNTHROID) 75 MCG tablet Take 75 mcg by mouth daily.   Multiple Vitamins-Minerals (MULTIVITAMIN WITH MINERALS) tablet Take 1 tablet by mouth daily.   No facility-administered medications prior to visit.   No Known Allergies  Immunization History  Administered Date(s) Administered   Hepatitis B, ADULT 01/03/2023   Hepb-cpg 05/02/2024   IPV 05/02/2024   MMR 01/03/2023   PFIZER(Purple Top)SARS-COV-2 Vaccination 03/28/2020, 04/25/2020, 12/01/2020   Tdap 05/12/2021   Varicella 05/12/2021, 01/03/2023    Health Maintenance  Topic Date Due   HIV Screening  Never done   Hepatitis C Screening  Never done   HPV VACCINES (1 - 3-dose SCDM series) Never done   COVID-19 Vaccine (4 - 2024-25 season) 08/26/2023   MAMMOGRAM  11/21/2023   Hepatitis B Vaccines (3 of 3 - 19+ 3-dose series) 06/27/2024   INFLUENZA VACCINE  07/25/2024   Cervical Cancer Screening (HPV/Pap Cotest)  05/25/2026   DTaP/Tdap/Td (2 - Td or Tdap) 05/13/2031   Meningococcal B Vaccine  Aged Out    Patient Care Team: Cyndi Shaver, PA-C as PCP - General (Physician Assistant)  Review of Systems  Constitutional:  Negative for fatigue and fever.  Respiratory:  Positive for chest tightness. Negative for cough and shortness of breath.   Cardiovascular:  Positive for chest pain. Negative for leg swelling.  Gastrointestinal:  Negative for abdominal pain.  Neurological:  Negative for dizziness and headaches.        Objective    BP 103/67   Pulse 70   Ht 5' 7.5 (1.715 m)   Wt 156 lb 6.4 oz (70.9 kg)   LMP 06/17/2024   BMI 24.13 kg/m     Physical Exam Constitutional:      General: She is awake.     Appearance: She is well-developed.  HENT:     Head: Normocephalic.   Eyes:     Conjunctiva/sclera: Conjunctivae normal.    Cardiovascular:     Rate and Rhythm: Normal rate and regular rhythm.     Heart sounds: Normal heart sounds.  Pulmonary:     Effort: Pulmonary  effort is normal.     Breath sounds: Normal breath sounds.   Musculoskeletal:     Right lower leg: No edema.     Left lower leg: No edema.   Skin:    General: Skin is warm.   Neurological:     Mental Status: She is alert and oriented to person, place, and time.   Psychiatric:        Attention and Perception: Attention normal.        Mood and Affect: Mood normal.        Speech: Speech normal.        Behavior: Behavior is cooperative.  Depression Screen    06/24/2024   10:17 AM  PHQ 2/9 Scores  PHQ - 2 Score 0   No results found for any visits on 06/24/24.  Assessment & Plan     Atypical chest pain Assessment & Plan: Reviewed ED visit, labs, EKG.  I do appreciate as musculoskeletal, but will check additional labs, lipids, and refer to cardiology.    Advise use of naproxen for pain management as needed   Orders: -     Comprehensive metabolic panel with GFR -     Lipid panel -     Ambulatory referral to Cardiology  Hypothyroidism, unspecified type Assessment & Plan: Manages with levothyroxine 75 mcg. Given pt reported fatigue, repeat tsh/t4  Orders: -     TSH -     T4, free  Other fatigue -     Comprehensive metabolic panel with GFR -     Vitamin B12 -     VITAMIN D 25 Hydroxy (Vit-D Deficiency, Fractures)  History of prediabetes -     Hemoglobin A1c  Family history of breast cancer Assessment & Plan: Sister dx at 23, deceased at 29. Pt reported sister with negative genetic testing. Reviewed high risk screening of mammogram, MRI.  Pt is due for both. Would start with MRI and then q 6 mo mammo  Orders: -     MR BREAST BILATERAL W WO CONTRAST INC CAD; Future  At high risk for breast cancer -     MR BREAST BILATERAL W WO CONTRAST INC CAD; Future   Return if symptoms worsen or fail to improve.      Manuelita Flatness, PA-C  [facility] at [facility] [phone] (phone) [phone] (fax)  [facility]

## 2024-07-03 ENCOUNTER — Ambulatory Visit: Payer: Self-pay | Admitting: Physician Assistant

## 2024-07-03 LAB — COMPREHENSIVE METABOLIC PANEL WITH GFR
ALT: 7 IU/L (ref 0–32)
AST: 16 IU/L (ref 0–40)
Albumin: 4.2 g/dL (ref 3.9–4.9)
Alkaline Phosphatase: 84 IU/L (ref 44–121)
BUN/Creatinine Ratio: 7 — ABNORMAL LOW (ref 9–23)
BUN: 5 mg/dL — ABNORMAL LOW (ref 6–24)
Bilirubin Total: 0.5 mg/dL (ref 0.0–1.2)
CO2: 18 mmol/L — ABNORMAL LOW (ref 20–29)
Calcium: 9.1 mg/dL (ref 8.7–10.2)
Chloride: 103 mmol/L (ref 96–106)
Creatinine, Ser: 0.68 mg/dL (ref 0.57–1.00)
Globulin, Total: 2.3 g/dL (ref 1.5–4.5)
Glucose: 84 mg/dL (ref 70–99)
Potassium: 4.4 mmol/L (ref 3.5–5.2)
Sodium: 135 mmol/L (ref 134–144)
Total Protein: 6.5 g/dL (ref 6.0–8.5)
eGFR: 111 mL/min/1.73 (ref >59)

## 2024-07-03 LAB — LIPID PANEL
Chol/HDL Ratio: 2.8 ratio (ref 0.0–4.4)
Cholesterol, Total: 138 mg/dL (ref 100–199)
HDL: 49 mg/dL (ref >39)
LDL Chol Calc (NIH): 73 mg/dL (ref 0–99)
Triglycerides: 82 mg/dL (ref 0–149)
VLDL Cholesterol Cal: 16 mg/dL (ref 5–40)

## 2024-07-03 LAB — VITAMIN D 25 HYDROXY (VIT D DEFICIENCY, FRACTURES): Vit D, 25-Hydroxy: 27 ng/mL — ABNORMAL LOW (ref 30.0–100.0)

## 2024-07-03 LAB — TSH: TSH: 1.14 u[IU]/mL (ref 0.450–4.500)

## 2024-07-03 LAB — HEMOGLOBIN A1C
Est. average glucose Bld gHb Est-mCnc: 105 mg/dL
Hgb A1c MFr Bld: 5.3 % (ref 4.8–5.6)

## 2024-07-03 LAB — T4, FREE: Free T4: 1.31 ng/dL (ref 0.82–1.77)

## 2024-07-03 LAB — VITAMIN B12: Vitamin B-12: 316 pg/mL (ref 232–1245)

## 2024-08-19 ENCOUNTER — Other Ambulatory Visit: Payer: Self-pay

## 2024-08-19 DIAGNOSIS — E039 Hypothyroidism, unspecified: Secondary | ICD-10-CM | POA: Insufficient documentation

## 2024-08-20 ENCOUNTER — Encounter: Payer: Self-pay | Admitting: Cardiology

## 2024-08-20 ENCOUNTER — Ambulatory Visit: Attending: Cardiology | Admitting: Cardiology

## 2024-08-20 VITALS — BP 90/64 | HR 71 | Ht 67.6 in | Wt 154.1 lb

## 2024-08-20 DIAGNOSIS — R0789 Other chest pain: Secondary | ICD-10-CM | POA: Diagnosis not present

## 2024-08-20 NOTE — Progress Notes (Signed)
 Cardiology Office Note:    Date:  08/20/2024   ID:  Suzanne Quinn, DOB 1981-02-12, MRN [id number]  PCP:  Cyndi Shaver, PA-C  Cardiologist:  Jennifer JONELLE Crape, MD   Referring MD: Cyndi Shaver, PA-C    ASSESSMENT:    1. Atypical chest pain    PLAN:    In order of problems listed above:  Primary prevention stressed with the patient.  Importance of compliance with diet medication stressed and patient verbalized standing. Cardiac murmur: Echocardiogram will be done to assess murmur heard on auscultation. Chest pain: Atypical in nature.  I reassured her about my findings and will do a plain treadmill stress test to reassess in the short-term. I reviewed lipids and blood pressure readings and discussed with the patient. Patient will be seen in follow-up appointment in 6 months or earlier if the patient has any concerns.    Medication Adjustments/Labs and Tests Ordered: Current medicines are reviewed at length with the patient today.  Concerns regarding medicines are outlined above.  Orders Placed This Encounter  Procedures   EKG 12-Lead   No orders of the defined types were placed in this encounter.    History of Present Illness:    Suzanne Quinn is a 43 y.o. female who is being seen today for the evaluation of chest pain at the request of Drubel, Shaver, PA-C.  Patient is a pleasant 43 year old female.  She has no significant past medical history.  She has history of hypothyroidism.  She has no risk factors from a cardiovascular standpoint.  She mentions to me that she has chest discomfort at times but this is not related to exertion.  It comes on when she lifts some weights heavier than usual.  She was in the emergency room and was evaluated several months ago and I reviewed those records extensively.  At the time of my evaluation, the patient is alert awake oriented and in no distress.  She has no history of hypertension dyslipidemia or diabetes mellitus.  Past Medical  History:  Diagnosis Date   Atypical chest pain 06/24/2024   Family history of breast cancer 06/24/2024   Hypothyroid    Hypothyroidism 06/24/2024   Plantar fasciitis of left foot 05/11/2023    Past Surgical History:  Procedure Laterality Date   NO PAST SURGERIES      Current Medications: Current Meds  Medication Sig   Cholecalciferol 125 MCG (5000 UT) capsule Take 5,000 Units by mouth daily.   levothyroxine (SYNTHROID) 75 MCG tablet Take 75 mcg by mouth daily.   Multiple Vitamins-Minerals (MULTIVITAMIN WITH MINERALS) tablet Take 1 tablet by mouth daily.     Allergies:   Patient has no known allergies.   Social History   Socioeconomic History   Marital status: Married    Spouse name: Not on file   Number of children: Not on file   Years of education: Not on file   Highest education level: Bachelor's degree (e.g., BA, AB, BS)  Occupational History   Not on file  Tobacco Use   Smoking status: Never   Smokeless tobacco: Never  Substance and Sexual Activity   Alcohol use: Never   Drug use: Never   Sexual activity: Yes    Birth control/protection: Condom  Other Topics Concern   Not on file  Social History Narrative   Not on file   Social Drivers of Health   Financial Resource Strain: Low Risk  (06/24/2024)   Overall Financial Resource Strain (CARDIA)    Difficulty of  Paying Living Expenses: Not hard at all  Food Insecurity: No Food Insecurity (06/24/2024)   Hunger Vital Sign    Worried About Running Out of Food in the Last Year: Never true    Ran Out of Food in the Last Year: Never true  Transportation Needs: No Transportation Needs (06/24/2024)   PRAPARE - Administrator, Civil Service (Medical): No    Lack of Transportation (Non-Medical): No  Physical Activity: Insufficiently Active (06/24/2024)   Exercise Vital Sign    Days of Exercise per Week: 3 days    Minutes of Exercise per Session: 30 min  Stress: No Stress Concern Present (06/24/2024)   [employer] of Occupational Health - Occupational Stress Questionnaire    Feeling of Stress: Not at all  Social Connections: Socially Integrated (06/24/2024)   Social Connection and Isolation Panel    Frequency of Communication with Friends and Family: More than three times a week    Frequency of Social Gatherings with Friends and Family: Once a week    Attends Religious Services: More than 4 times per year    Active Member of [employer] or Organizations: Yes    Attends Engineer, structural: More than 4 times per year    Marital Status: Married     Family History: The patient's family history includes Breast cancer in her sister; Cancer in her sister.  ROS:   Please see the history of present illness.    All other systems reviewed and are negative.  EKGs/Labs/Other Studies Reviewed:    The following studies were reviewed today: .SABRA   EKG reveals sinus rhythm left atrial enlargement and nonspecific ST-T changes  Recent Labs: 05/17/2024: Hemoglobin 12.1; Platelets 343 07/02/2024: ALT 7; BUN 5; Creatinine, Ser 0.68; Potassium 4.4; Sodium 135; TSH 1.140  Recent Lipid Panel    Component Value Date/Time   CHOL 138 07/02/2024 0855   TRIG 82 07/02/2024 0855   HDL 49 07/02/2024 0855   CHOLHDL 2.8 07/02/2024 0855   LDLCALC 73 07/02/2024 0855    Physical Exam:    VS:  BP 90/64   Pulse 71   Ht 5' 7.6 (1.717 m)   Wt 154 lb 1.9 oz (69.9 kg)   SpO2 98%   BMI 23.71 kg/m     Wt Readings from Last 3 Encounters:  08/20/24 154 lb 1.9 oz (69.9 kg)  06/24/24 156 lb 6.4 oz (70.9 kg)  05/17/24 159 lb (72.1 kg)     GEN: Patient is in no acute distress HEENT: Normal NECK: No JVD; No carotid bruits LYMPHATICS: No lymphadenopathy CARDIAC: S1 S2 regular, 2/6 systolic murmur at the apex. RESPIRATORY:  Clear to auscultation without rales, wheezing or rhonchi  ABDOMEN: Soft, non-tender, non-distended MUSCULOSKELETAL:  No edema; No deformity  SKIN: Warm and dry NEUROLOGIC:  Alert and  oriented x 3 PSYCHIATRIC:  Normal affect    Signed, Jennifer JONELLE Crape, MD  08/20/2024 1:10 PM    [facility]

## 2024-08-20 NOTE — Patient Instructions (Signed)
 Medication Instructions:  Your physician recommends that you continue on your current medications as directed. Please refer to the Current Medication list given to you today.  *If you need a refill on your cardiac medications before your next appointment, please call your pharmacy*   Lab Work: None ordered If you have labs (blood work) drawn today and your tests are completely normal, you will receive your results only by: MyChart Message (if you have MyChart) OR A paper copy in the mail If you have any lab test that is abnormal or we need to change your treatment, we will call you to review the results.  Testing/Procedures: Your physician has requested that you have an echocardiogram. Echocardiography is a painless test that uses sound waves to create images of your heart. It provides your doctor with information about the size and shape of your heart and how well your heart's chambers and valves are working. This procedure takes approximately one hour. There are no restrictions for this procedure. Please do NOT wear cologne, perfume, aftershave, or lotions (deodorant is allowed). Please arrive 15 minutes prior to your appointment time.  Please note: We ask at that you not bring children with you during ultrasound (echo/ vascular) testing. Due to room size and safety concerns, children are not allowed in the ultrasound rooms during exams. Our front office staff cannot provide observation of children in our lobby area while testing is being conducted. An adult accompanying a patient to their appointment will only be allowed in the ultrasound room at the discretion of the ultrasound technician under special circumstances. We apologize for any inconvenience.   [facility] at [facility] [address] [place], [place] [address] Phone: [phone]  August 20, 2024      Suzanne Quinn DOB: 1981-11-13 MRN: [id number] [address] [place]  [address]   Dear Suzanne Quinn,    Please arrive 15 minutes prior to your appointment time for registration and insurance purposes.  The test will take approximately 45 minutes to complete.  How to prepare for your Exercise Stress Test: Do bring a list of your current medications with you.  If not listed below, you may take your medications as normal. Do wear comfortable clothes (no dresses or overalls) and walking shoes, tennis shoes preferred (no heels or open toed shoes are allowed) Do Not wear cologne, perfume, aftershave or lotions (deodorant is allowed). Please report to [address] for your test.  If these instructions are not followed, your test will have to be rescheduled.  If you have questions or concerns about your appointment, you can call the Stress Lab at [phone].  If you cannot keep your appointment, please provide 24 hours notification to the Stress Lab, to avoid a possible $50 charge to your account.  Follow-Up: At [facility], you and your health needs are our priority.  As part of our continuing mission to provide you with exceptional heart care, we have created designated Provider Care Teams.  These Care Teams include your primary Cardiologist (physician) and Advanced Practice Providers (APPs -  Physician Assistants and Nurse Practitioners) who all work together to provide you with the care you need, when you need it.  We recommend signing up for the patient portal called MyChart.  Sign up information is provided on this After Visit Summary.  MyChart is used to connect with patients for Virtual Visits (Telemedicine).  Patients are able to view lab/test results, encounter notes, upcoming appointments, etc.  Non-urgent messages  can be sent to your provider as well.   To learn more about what you can do with MyChart, go to [email or web address].    Your next appointment:   9 month(s)  The format for your next appointment:   In Person  Provider:    Jennifer Crape, MD   Other Instructions Echocardiogram An echocardiogram is a test that uses sound waves (ultrasound) to produce images of the heart. Images from an echocardiogram can provide important information about: Heart size and shape. The size and thickness and movement of your heart's walls. Heart muscle function and strength. Heart valve function or if you have stenosis. Stenosis is when the heart valves are too narrow. If blood is flowing backward through the heart valves (regurgitation). A tumor or infectious growth around the heart valves. Areas of heart muscle that are not working well because of poor blood flow or injury from a heart attack. Aneurysm detection. An aneurysm is a weak or damaged part of an artery wall. The wall bulges out from the normal force of blood pumping through the body. Tell a health care provider about: Any allergies you have. All medicines you are taking, including vitamins, herbs, eye drops, creams, and over-the-counter medicines. Any blood disorders you have. Any surgeries you have had. Any medical conditions you have. Whether you are pregnant or may be pregnant. What are the risks? Generally, this is a safe test. However, problems may occur, including an allergic reaction to dye (contrast) that may be used during the test. What happens before the test? No specific preparation is needed. You may eat and drink normally. What happens during the test? You will take off your clothes from the waist up and put on a hospital gown. Electrodes or electrocardiogram (ECG)patches may be placed on your chest. The electrodes or patches are then connected to a device that monitors your heart rate and rhythm. You will lie down on a table for an ultrasound exam. A gel will be applied to your chest to help sound waves pass through your skin. A handheld device, called a transducer, will be pressed against your chest and moved over your heart. The transducer  produces sound waves that travel to your heart and bounce back (or echo back) to the transducer. These sound waves will be captured in real-time and changed into images of your heart that can be viewed on a video monitor. The images will be recorded on a computer and reviewed by your health care provider. You may be asked to change positions or hold your breath for a short time. This makes it easier to get different views or better views of your heart. In some cases, you may receive contrast through an IV in one of your veins. This can improve the quality of the pictures from your heart. The procedure may vary among health care providers and hospitals.   What can I expect after the test? You may return to your normal, everyday life, including diet, activities, and medicines, unless your health care provider tells you not to do that. Follow these instructions at home: It is up to you to get the results of your test. Ask your health care provider, or the department that is doing the test, when your results will be ready. Keep all follow-up visits. This is important. Summary An echocardiogram is a test that uses sound waves (ultrasound) to produce images of the heart. Images from an echocardiogram can provide important information about the size and shape of your heart,  heart muscle function, heart valve function, and other possible heart problems. You do not need to do anything to prepare before this test. You may eat and drink normally. After the echocardiogram is completed, you may return to your normal, everyday life, unless your health care provider tells you not to do that. This information is not intended to replace advice given to you by your health care provider. Make sure you discuss any questions you have with your health care provider. Document Revised: 08/03/2020 Document Reviewed: 08/03/2020 Elsevier Patient Education  2021 Elsevier Inc.   Important Information About Sugar

## 2024-09-15 ENCOUNTER — Ambulatory Visit (HOSPITAL_BASED_OUTPATIENT_CLINIC_OR_DEPARTMENT_OTHER)
Admission: RE | Admit: 2024-09-15 | Discharge: 2024-09-15 | Disposition: A | Discharge status: Home / Self Care | Source: Ambulatory Visit | Attending: Cardiology | Admitting: Cardiology

## 2024-09-15 DIAGNOSIS — R0789 Other chest pain: Secondary | ICD-10-CM | POA: Diagnosis present

## 2024-09-15 DIAGNOSIS — R079 Chest pain, unspecified: Secondary | ICD-10-CM

## 2024-09-15 LAB — ECHOCARDIOGRAM COMPLETE
AR max vel: 2.74 cm2
AV Area VTI: 2.77 cm2
AV Area mean vel: 2.77 cm2
AV Mean grad: 3 mmHg
AV Peak grad: 6.2 mmHg
Ao pk vel: 1.24 m/s
Area-P 1/2: 4.17 cm2
Calc EF: 61.8 %
S' Lateral: 2.8 cm
Single Plane A2C EF: 62.4 %
Single Plane A4C EF: 64.1 %

## 2024-09-23 ENCOUNTER — Encounter (HOSPITAL_COMMUNITY): Payer: Self-pay

## 2024-09-24 ENCOUNTER — Ambulatory Visit: Payer: Self-pay | Admitting: Cardiology

## 2024-10-03 ENCOUNTER — Ambulatory Visit (HOSPITAL_COMMUNITY)
Admission: RE | Admit: 2024-10-03 | Discharge: 2024-10-03 | Disposition: A | Discharge status: Home / Self Care | Source: Ambulatory Visit | Attending: Cardiology | Admitting: Cardiology

## 2024-10-03 DIAGNOSIS — R0789 Other chest pain: Secondary | ICD-10-CM | POA: Diagnosis not present

## 2024-10-03 LAB — EXERCISE TOLERANCE TEST
Angina Index: 0
Duke Treadmill Score: 9
Estimated workload: 10.7
Exercise duration (min): 9 min
Exercise duration (sec): 25 s
MPHR: 178 {beats}/min
Peak HR: 176 {beats}/min
Percent HR: 98 %
Rest HR: 71 {beats}/min
ST Depression (mm): 0 mm
